# Patient Record
Sex: Male | Born: 1970 | Race: Black or African American | Hispanic: No | Marital: Married | Smoking: Never smoker
Health system: Southern US, Community
[De-identification: ages and names within clinical notes are randomized; demographics above are authoritative.]

## PROBLEM LIST (undated history)

## (undated) HISTORY — PX: ROTATOR CUFF REPAIR: SHX139

---

## 2005-10-24 ENCOUNTER — Emergency Department (HOSPITAL_COMMUNITY): Admission: EM | Admit: 2005-10-24 | Discharge: 2005-10-25 | Payer: Self-pay | Admitting: Emergency Medicine

## 2005-12-12 ENCOUNTER — Ambulatory Visit (HOSPITAL_COMMUNITY): Admission: RE | Admit: 2005-12-12 | Discharge: 2005-12-12 | Payer: Self-pay | Admitting: Orthopedic Surgery

## 2006-08-11 ENCOUNTER — Emergency Department (HOSPITAL_COMMUNITY): Admission: EM | Admit: 2006-08-11 | Discharge: 2006-08-11 | Payer: Self-pay | Admitting: Emergency Medicine

## 2013-11-04 ENCOUNTER — Ambulatory Visit: Payer: Managed Care, Other (non HMO)

## 2013-11-04 ENCOUNTER — Ambulatory Visit (INDEPENDENT_AMBULATORY_CARE_PROVIDER_SITE_OTHER): Payer: Managed Care, Other (non HMO) | Admitting: Internal Medicine

## 2013-11-04 VITALS — BP 124/72 | HR 65 | Temp 97.5°F | Resp 18 | Ht 66.0 in | Wt 171.0 lb

## 2013-11-04 DIAGNOSIS — R0989 Other specified symptoms and signs involving the circulatory and respiratory systems: Secondary | ICD-10-CM

## 2013-11-04 DIAGNOSIS — J301 Allergic rhinitis due to pollen: Secondary | ICD-10-CM

## 2013-11-04 DIAGNOSIS — Z23 Encounter for immunization: Secondary | ICD-10-CM

## 2013-11-04 DIAGNOSIS — Z0000 Encounter for general adult medical examination without abnormal findings: Secondary | ICD-10-CM

## 2013-11-04 DIAGNOSIS — R0683 Snoring: Secondary | ICD-10-CM

## 2013-11-04 DIAGNOSIS — J329 Chronic sinusitis, unspecified: Secondary | ICD-10-CM

## 2013-11-04 DIAGNOSIS — R0609 Other forms of dyspnea: Secondary | ICD-10-CM

## 2013-11-04 LAB — COMPLETE METABOLIC PANEL WITH GFR
ALBUMIN: 4.7 g/dL (ref 3.5–5.2)
ALK PHOS: 61 U/L (ref 39–117)
ALT: 19 U/L (ref 0–53)
AST: 27 U/L (ref 0–37)
BUN: 9 mg/dL (ref 6–23)
CO2: 29 meq/L (ref 19–32)
CREATININE: 1.07 mg/dL (ref 0.50–1.35)
Calcium: 9.6 mg/dL (ref 8.4–10.5)
Chloride: 103 mEq/L (ref 96–112)
GFR, EST NON AFRICAN AMERICAN: 85 mL/min
GFR, Est African American: >89 mL/min
GLUCOSE: 93 mg/dL (ref 70–99)
Potassium: 4.2 mEq/L (ref 3.5–5.3)
SODIUM: 139 meq/L (ref 135–145)
Total Bilirubin: 0.5 mg/dL (ref 0.2–1.2)
Total Protein: 7.3 g/dL (ref 6.0–8.3)

## 2013-11-04 LAB — LIPID PANEL
CHOL/HDL RATIO: 2.6 ratio
CHOLESTEROL: 203 mg/dL — AB (ref 0–200)
HDL: 77 mg/dL (ref >39)
LDL Cholesterol: 117 mg/dL — ABNORMAL HIGH (ref 0–99)
TRIGLYCERIDES: 43 mg/dL (ref <150)
VLDL: 9 mg/dL (ref 0–40)

## 2013-11-04 LAB — POCT CBC
Granulocyte percent: 69.5 %G (ref 37–80)
HEMATOCRIT: 43.1 % — AB (ref 43.5–53.7)
Hemoglobin: 13.7 g/dL — AB (ref 14.1–18.1)
Lymph, poc: 1.3 (ref 0.6–3.4)
MCH: 29.4 pg (ref 27–31.2)
MCHC: 31.8 g/dL (ref 31.8–35.4)
MCV: 92.5 fL (ref 80–97)
MID (CBC): 0.3 (ref 0–0.9)
MPV: 10 fL (ref 0–99.8)
POC GRANULOCYTE: 3.5 (ref 2–6.9)
POC LYMPH %: 25.1 % (ref 10–50)
POC MID %: 5.4 %M (ref 0–12)
Platelet Count, POC: 230 10*3/uL (ref 142–424)
RBC: 4.66 M/uL — AB (ref 4.69–6.13)
RDW, POC: 13.7 %
WBC: 5.1 10*3/uL (ref 4.6–10.2)

## 2013-11-04 MED ORDER — FLUTICASONE PROPIONATE 50 MCG/ACT NA SUSP: NASAL | Status: AC

## 2013-11-04 MED ORDER — AMOXICILLIN 500 MG PO CAPS: 1000.0000 mg | ORAL_CAPSULE | Freq: Two times a day (BID) | ORAL | Status: AC

## 2013-11-04 MED ORDER — MONTELUKAST SODIUM 10 MG PO TABS: 10.0000 mg | ORAL_TABLET | Freq: Every day | ORAL | Status: AC

## 2013-11-04 NOTE — Progress Notes (Signed)
Subjective:    Patient ID: Johnny Larsen, male    DOB: 01-23-1971, 43 y.o.   MRN: [id number] Here for his first CPE in many hours Basically healthy Works hard and enjoys it Married with children 21 and 60 Wonders if his testosterone is low Sinus symptoms are interfering with life  Sinusitis This is a chronic problem. The current episode started more than 1 year ago. The problem has been gradually worsening since onset. There has been no fever. His pain is at a severity of 2/10. The pain is mild. Associated symptoms include congestion, headaches, a hoarse voice, sinus pressure and sneezing. Pertinent negatives include no coughing, diaphoresis, ear pain, neck pain, shortness of breath, sore throat or swollen glands. Past treatments include saline sprays. The treatment provided no relief.   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  HENT: Positive for congestion, hoarse voice, sinus pressure and sneezing. Negative for ear pain and sore throat.   Respiratory: Negative for cough and shortness of breath.   Musculoskeletal: Negative for neck pain.  Neurological: Positive for headaches.  Review of systems negative except for sinus congestion symptoms, snoring Significantly there is no daytime hypersomnolence or nonrestorative sleep and no observed sleep apnea Snoring is significant for wife to need to sleep somewhere else There is very heavy breathing all the time History of nasal problems as a child    Objective:   Physical Exam  Constitutional: He is oriented to person, place, and time. He appears well-developed and well-nourished.  HENT:  Head: Normocephalic.  Right Ear: External ear normal.  Left Ear: External  ear normal.  Nose: Nose normal.  Mouth/Throat: Oropharynx is clear and moist.  Tms and canals clear Narrow posterior pharynx Boggy turbinates Nasal intonation significant  Eyes: Conjunctivae and EOM are normal. Pupils are equal, round, and reactive to light.  Neck: Normal range of motion. Neck supple. No thyromegaly present.  Cardiovascular: Normal rate, regular rhythm, normal heart sounds and intact distal pulses.   No murmur heard. Pulmonary/Chest: Effort normal and breath sounds normal. No respiratory distress. He has no wheezes. He has no rales.  Abdominal: Soft. Bowel sounds are normal. He exhibits no distension and no mass. There is no tenderness. There is no rebound and no guarding.  No hepatosplenomegaly  Musculoskeletal: Normal range of motion. He exhibits no edema and no tenderness.  Lymphadenopathy:    He has no cervical adenopathy.  Neurological: He is alert and oriented to person, place, and time. He has normal reflexes. No cranial nerve deficit. He exhibits normal muscle tone. Coordination normal.  Skin: Skin is warm and dry. No rash noted.  Psychiatric: He has a normal mood and affect. His behavior is normal. Judgment and thought content normal.    [facility] reading (PRIMARY) by  Dr. Merla Riches chest x-ray clear Results for orders placed in visit on 11/04/13  POCT CBC      Result Value Ref Range   WBC 5.1  4.6 - 10.2 K/uL   Lymph, poc 1.3  0.6 - 3.4   POC LYMPH PERCENT 25.1  10 - 50 %L   MID (cbc) 0.3  0 - 0.9   POC MID % 5.4  0 - 12 %M   POC Granulocyte 3.5  2 -  6.9   Granulocyte percent 69.5  37 - 80 %G   RBC 4.66 (*) 4.69 - 6.13 M/uL   Hemoglobin 13.7 (*) 14.1 - 18.1 g/dL   HCT, POC 40.943.1 (*) 81.143.5 - 53.7 %   MCV 92.5  80 - 97 fL   MCH, POC 29.4  27 - 31.2 pg   MCHC 31.8  31.8 - 35.4 g/dL   RDW, POC 91.413.7     Platelet Count, POC 230  142 - 424 K/uL   MPV 10  0 - 99.8 fL           Assessment & Plan:  Routine general medical examination at a health care  facility - Plan: POCT CBC, Lipid panel, COMPLETE METABOLIC PANEL WITH GFR, PSA, Testosterone, free, total  Snoring - Plan: DG Chest 2 View  Allergic rhinitis due to pollen  Sinusitis, chronic  Meds ordered this encounter  Medications  . fluticasone (FLONASE) 50 MCG/ACT nasal spray    Sig: 2 sprays each nostril twice a day for the next 2 months    Dispense:  16 g    Refill:  6  . montelukast (SINGULAIR) 10 MG tablet    Sig: Take 1 tablet (10 mg total) by mouth at bedtime.    Dispense:  30 tablet    Refill:  3  . amoxicillin (AMOXIL) 500 MG capsule    Sig: Take 2 capsules (1,000 mg total) by mouth 2 (two) times daily.    Dispense:  40 capsule    Refill:  0   if sinus congestion and snoring are not cleared in 4-6 weeks he will call for a referral to ENT for nasoscopy  Mail lab result

## 2013-11-05 LAB — TESTOSTERONE, FREE, TOTAL, SHBG
SEX HORMONE BINDING: 57 nmol/L (ref 13–71)
TESTOSTERONE FREE: 103.5 pg/mL (ref 47.0–244.0)
TESTOSTERONE-% FREE: 1.5 % — AB (ref 1.6–2.9)
TESTOSTERONE: 677 ng/dL (ref 300–890)

## 2013-11-05 LAB — PSA: PSA: 0.52 ng/mL (ref <=4.00)

## 2014-01-02 ENCOUNTER — Ambulatory Visit (INDEPENDENT_AMBULATORY_CARE_PROVIDER_SITE_OTHER): Payer: Managed Care, Other (non HMO) | Admitting: Physician Assistant

## 2014-01-02 VITALS — BP 118/72 | HR 76 | Temp 97.8°F | Resp 18 | Ht 66.0 in | Wt 170.8 lb

## 2014-01-02 DIAGNOSIS — H0014 Chalazion left upper eyelid: Secondary | ICD-10-CM

## 2014-01-02 DIAGNOSIS — H0019 Chalazion unspecified eye, unspecified eyelid: Secondary | ICD-10-CM

## 2014-01-02 NOTE — Progress Notes (Signed)
   Subjective:    Patient ID: Johnny Larsen, male    DOB: 1971-07-29, 43 y.o.   MRN: [id number]  HPI Pt presents to clinic with lateral upper left eyelid swelling.  Started with edge of eyelid irritated last week with slight erythema and then the entire upper eyelid got slightly swollen.  Then he noticed that he has a local area of swelling, "like a ball" on his upper left eyelid.  The area does not hurt that much but it is irritating.  He has had no problems with the eyeball.  No vision changes.  He Has slight drainage in the lateral corner of his eye in the am that is crusting. He has been using warm compresses and thinks it might be a little smaller today. He has not ever seen an eye dr. Review of Systems  HENT: Negative.   Eyes: Positive for discharge. Negative for photophobia, pain, redness, itching and visual disturbance.       Objective:   Physical Exam  Vitals reviewed. Constitutional: He is oriented to person, place, and time. He appears well-developed and well-nourished.  HENT:  Head: Normocephalic and atraumatic.  Right Ear: External ear normal.  Left Ear: External ear normal.  Eyes: Conjunctivae, EOM and lids are normal. Pupils are equal, round, and reactive to light. Right eye exhibits no hordeolum. Left eye exhibits no hordeolum.    Pulmonary/Chest: Effort normal.  Abdominal: Soft.  Neurological: He is alert and oriented to person, place, and time.  Skin: Skin is warm and dry.  Psychiatric: He has a normal mood and affect. His behavior is normal. Judgment and thought content normal.      Assessment & Plan:  Chalazion of left upper eyelid - Plan: Ambulatory referral to Ophthalmology pt will continue warm compresses and gentle washing of the eyelid.  We will go ahead with the referral due to the fact that he has had the area for 8 days.  Benny Lennert PA-C  [facility] 01/02/2014 5:00 PM

## 2014-01-02 NOTE — Patient Instructions (Signed)
[employer] no tears shampoo to the eye, gentle warm compresses  Chalazion A chalazion is a swelling or hard lump on the eyelid caused by a blocked oil gland. Chalazions may occur on the upper or the lower eyelid.  CAUSES  Oil gland in the eyelid becomes blocked. SYMPTOMS   Swelling or hard lump on the eyelid. This lump may make it hard to see out of the eye.  The swelling may spread to areas around the eye. TREATMENT   Although some chalazions disappear by themselves in 1 or 2 months, some chalazions may need to be removed.  Medicines to treat an infection may be required. HOME CARE INSTRUCTIONS   Wash your hands often and dry them with a clean towel. Do not touch the chalazion.  Apply heat to the eyelid several times a day for 10 minutes to help ease discomfort and bring any yellowish white fluid (pus) to the surface. One way to apply heat to a chalazion is to use the handle of a metal spoon.  Hold the handle under hot water until it is hot, and then wrap the handle in paper towels so that the heat can come through without burning your skin.  Hold the wrapped handle against the chalazion and reheat the spoon handle as needed.  Apply heat in this fashion for 10 minutes, 4 times per day.  Return to your caregiver to have the pus removed if it does not break (rupture) on its own.  Do not try to remove the pus yourself by squeezing the chalazion or sticking it with a pin or needle.  Only take over-the-counter or prescription medicines for pain, discomfort, or fever as directed by your caregiver. SEEK IMMEDIATE MEDICAL CARE IF:   You have pain in your eye.  Your vision changes.  The chalazion does not go away.  The chalazion becomes painful, red, or swollen, grows larger, or does not start to disappear after 2 weeks. MAKE SURE YOU:   Understand these instructions.  Will watch your condition.  Will get help right away if you are not doing well or get worse. Document  Released: 07/28/2000 Document Revised: 10/23/2011 Document Reviewed: 11/15/2009 [facility] Patient Information 2014 [place], [place].

## 2016-07-23 ENCOUNTER — Emergency Department (HOSPITAL_COMMUNITY): Payer: 59

## 2016-07-23 ENCOUNTER — Emergency Department (HOSPITAL_COMMUNITY)
Admission: EM | Admit: 2016-07-23 | Discharge: 2016-07-23 | Disposition: A | Discharge status: Home / Self Care | Payer: 59 | Attending: Emergency Medicine | Admitting: Emergency Medicine

## 2016-07-23 ENCOUNTER — Encounter (HOSPITAL_COMMUNITY): Payer: Self-pay | Admitting: Oncology

## 2016-07-23 DIAGNOSIS — R509 Fever, unspecified: Secondary | ICD-10-CM | POA: Diagnosis present

## 2016-07-23 DIAGNOSIS — J181 Lobar pneumonia, unspecified organism: Secondary | ICD-10-CM | POA: Diagnosis not present

## 2016-07-23 DIAGNOSIS — J189 Pneumonia, unspecified organism: Secondary | ICD-10-CM

## 2016-07-23 LAB — BASIC METABOLIC PANEL
ANION GAP: 7 (ref 5–15)
BUN: 15 mg/dL (ref 6–20)
CALCIUM: 9.1 mg/dL (ref 8.9–10.3)
CHLORIDE: 105 mmol/L (ref 101–111)
CO2: 25 mmol/L (ref 22–32)
Creatinine, Ser: 1.32 mg/dL — ABNORMAL HIGH (ref 0.61–1.24)
GFR calc non Af Amer: >60 mL/min (ref >60)
GLUCOSE: 162 mg/dL — AB (ref 65–99)
POTASSIUM: 3.5 mmol/L (ref 3.5–5.1)
Sodium: 137 mmol/L (ref 135–145)

## 2016-07-23 LAB — CBC WITH DIFFERENTIAL/PLATELET
BASOS PCT: 0 %
Basophils Absolute: 0 10*3/uL (ref 0.0–0.1)
Eosinophils Absolute: 0 10*3/uL (ref 0.0–0.7)
Eosinophils Relative: 0 %
HEMATOCRIT: 37.7 % — AB (ref 39.0–52.0)
HEMOGLOBIN: 13.5 g/dL (ref 13.0–17.0)
LYMPHS PCT: 3 %
Lymphs Abs: 0.7 10*3/uL (ref 0.7–4.0)
MCH: 30.1 pg (ref 26.0–34.0)
MCHC: 35.8 g/dL (ref 30.0–36.0)
MCV: 84.2 fL (ref 78.0–100.0)
MONO ABS: 1.3 10*3/uL — AB (ref 0.1–1.0)
MONOS PCT: 6 %
NEUTROS ABS: 18.2 10*3/uL — AB (ref 1.7–7.7)
NEUTROS PCT: 91 %
Platelets: 179 10*3/uL (ref 150–400)
RBC: 4.48 MIL/uL (ref 4.22–5.81)
RDW: 12.4 % (ref 11.5–15.5)
WBC: 20.1 10*3/uL — ABNORMAL HIGH (ref 4.0–10.5)

## 2016-07-23 MED ORDER — SODIUM CHLORIDE 0.9 % IV BOLUS (SEPSIS)
1000.0000 mL | Freq: Once | INTRAVENOUS | Status: AC
  Administered 2016-07-23: 1000 mL via INTRAVENOUS

## 2016-07-23 MED ORDER — ACETAMINOPHEN 325 MG PO TABS
650.0000 mg | ORAL_TABLET | Freq: Once | ORAL | Status: AC
  Administered 2016-07-23: 650 mg via ORAL
  Filled 2016-07-23: qty 2

## 2016-07-23 MED ORDER — AZITHROMYCIN 250 MG PO TABS: ORAL_TABLET | ORAL | 0 refills | Status: AC

## 2016-07-23 MED ORDER — DEXTROSE 5 % IV SOLN
1.0000 g | Freq: Once | INTRAVENOUS | Status: AC
  Administered 2016-07-23: 1 g via INTRAVENOUS
  Filled 2016-07-23: qty 10

## 2016-07-23 MED ORDER — AZITHROMYCIN 250 MG PO TABS
500.0000 mg | ORAL_TABLET | Freq: Once | ORAL | Status: AC
  Administered 2016-07-23: 500 mg via ORAL
  Filled 2016-07-23: qty 2

## 2016-07-23 MED ORDER — IBUPROFEN 800 MG PO TABS
800.0000 mg | ORAL_TABLET | Freq: Once | ORAL | Status: AC
  Administered 2016-07-23: 800 mg via ORAL
  Filled 2016-07-23: qty 1

## 2016-07-23 NOTE — ED Provider Notes (Signed)
[facility]-EMERGENCY DEPT Provider Note   CSN: [id number] Arrival date & time: 07/23/16  0108  By signing my name below, I, Modena JanskyAlbert Thayil, attest that this documentation has been prepared under the direction and in the presence of Zadie Rhineonald Kalyani Maeda, MD . Electronically Signed: Modena JanskyAlbert Thayil, Scribe. 07/23/2016. 1:24 AM.  History   Chief Complaint Chief Complaint  Patient presents with  . Fever    The history is provided by the patient. No language interpreter was used.  Fever   This is a new problem. The current episode started 3 to 5 hours ago. The problem occurs constantly. The problem has not changed since onset.His temperature was unmeasured prior to arrival. Associated symptoms include headaches. Pertinent negatives include no diarrhea, no sore throat and no cough. Treatments tried: tylenol. The treatment provided no relief.   HPI Comments: Johnny Larsen is a 45 y.o. male who presents to the Emergency Department complaining of a constant moderate subjective fever that started today. He states he had a sudden onset of associated tachycardia, SOB, headache, lightheadedness, and dizziness. Pt's temperature in the ED today was 103.1. He states he took tylenol for the fever 2.5 hours ago with no relief. He denies any recent travel, cough, diarrhea, rash, sore throat, or other complaints.    PMH - none Soc hx - nonsmoker Past Surgical History:  Procedure Laterality Date  . ROTATOR CUFF REPAIR         Home Medications    Prior to Admission medications   Medication Sig Start Date End Date Taking? Authorizing Provider  fluticasone (FLONASE) 50 MCG/ACT nasal spray 2 sprays each nostril twice a day for the next 2 months 11/04/13   Tonye Pearsonobert P Doolittle, MD  montelukast (SINGULAIR) 10 MG tablet Take 1 tablet (10 mg total) by mouth at bedtime. 11/04/13   Tonye Pearsonobert P Doolittle, MD    Family History No family history on file.  Social History Social History  Substance Use Topics  .  Smoking status: Never Smoker  . Smokeless tobacco: Never Used  . Alcohol use 1.5 oz/week    3 Standard drinks or equivalent per week     Allergies   Patient has no known allergies.   Review of Systems Review of Systems  Constitutional: Positive for fever (Tmax: 103.1).  HENT: Negative for sore throat.   Respiratory: Positive for shortness of breath. Negative for cough.   Cardiovascular: Positive for palpitations.  Gastrointestinal: Negative for diarrhea.  Skin: Negative for rash.  Neurological: Positive for dizziness, light-headedness and headaches.  All other systems reviewed and are negative.    Physical Exam Updated Vital Signs BP 113/72 (BP Location: Right Arm)   Pulse 112   Temp (!) 103.1 F (39.5 C) (Oral)   Resp 20   Ht 5\' 7"  (1.702 m)   Wt 165 lb (74.8 kg)   SpO2 95%   BMI 25.84 kg/m   Physical Exam  Nursing note and vitals reviewed. CONSTITUTIONAL: Well developed/well nourished HEAD: Normocephalic/atraumatic EYES: EOMI/PERRL ENMT: Mucous membranes moist, uvula midline, no exudates,  NECK: supple no meningeal signs SPINE/BACK:entire spine nontender CV: S1/S2 noted, no murmurs/rubs/gallops noted LUNGS: Mild tacynpea noted, decreased breath sounds bilaterally  ABDOMEN: soft, nontender, no rebound or guarding, bowel sounds noted throughout abdomen GU:no cva tenderness NEURO: Pt is awake/alert/appropriate, moves all extremitiesx4.  No facial droop.   EXTREMITIES: pulses normal/equal, full ROM SKIN: warm, color normal PSYCH: no abnormalities of mood noted, alert and oriented to situation  ED  Treatments / Results  DIAGNOSTIC STUDIES: Oxygen Saturation is 95% on RA, normal by my interpretation.    COORDINATION OF CARE: 1:33 AM- Pt advised of plan for treatment and pt agrees.  Labs (all labs ordered are listed, but only abnormal results are displayed) Labs Reviewed  BASIC METABOLIC PANEL - Abnormal; Notable for the following:       Result Value    Glucose, Bld 162 (*)    Creatinine, Ser 1.32 (*)    All other components within normal limits  CBC WITH DIFFERENTIAL/PLATELET - Abnormal; Notable for the following:    WBC 20.1 (*)    HCT 37.7 (*)    Neutro Abs 18.2 (*)    Monocytes Absolute 1.3 (*)    All other components within normal limits    EKG  EKG Interpretation None       Radiology Dg Chest 2 View  Result Date: 07/23/2016 CLINICAL DATA:  Fever chills dyspnea and body aches today. EXAM: CHEST  2 VIEW COMPARISON:  11/04/2013 FINDINGS: Rounded opacity in the right midlung, probably located in the right middle lobe. Given the clinical history this likely represents pneumonia, but neoplasm is not entirely excluded. Mild atelectatic appearing opacities are present in the left base. Pulmonary vasculature is normal. No pleural effusions. IMPRESSION: Rounded right middle lobe opacity, probably pneumonia. Followup PA and lateral chest X-ray is recommended in 3-4 weeks following trial of antibiotic therapy to ensure resolution and exclude underlying malignancy. Electronically Signed   By: Ellery Plunkaniel R Mitchell M.D.   On: 07/23/2016 02:07    Procedures Procedures (including critical care time)  Medications Ordered in ED Medications  cefTRIAXone (ROCEPHIN) 1 g in dextrose 5 % 50 mL IVPB (1 g Intravenous Not Given 07/23/16 0227)  ibuprofen (ADVIL,MOTRIN) tablet 800 mg (800 mg Oral Given 07/23/16 0134)  sodium chloride 0.9 % bolus 1,000 mL (0 mLs Intravenous Stopped 07/23/16 0228)  azithromycin (ZITHROMAX) tablet 500 mg (500 mg Oral Given 07/23/16 0226)     Initial Impression / Assessment and Plan / ED Course  I have reviewed the triage vital signs and the nursing notes.  Pertinent labs & imaging results that were available during my care of the patient were reviewed by me and considered in my medical decision making (see chart for details).  Clinical Course     2:37 AM Pt found to have pneumonia by CXR This would explain  fever/dizziness/sob Pt stable, not septic appearing and no other significant health problems I anticipate discharge home 3:29 AM Pt continues to improve No hypotension Tachycardia improved No respiratory distress noted Pt resting comfortably 5:04 AM Pt improved Vitals improved He is ambulatory without difficulty No hypoxia He is taking PO fluid HA is resolved He would like to go home We discussed strict ER return precautions Also advised need for repeat CXR in one month to ensure infiltrate resolved  Final Clinical Impressions(s) / ED Diagnoses   Final diagnoses:  Community acquired pneumonia of right middle lobe of lung (HCC)    New Prescriptions New Prescriptions   AZITHROMYCIN (ZITHROMAX) 250 MG TABLET    One tablet PO daily for 4 days - start on 07/24/16   I personally performed the services described in this documentation, which was scribed in my presence. The recorded information has been reviewed and is accurate.        Zadie Rhineonald Shelia [place], MD 07/23/16 [phone]

## 2016-07-23 NOTE — ED Notes (Signed)
Patient transported to X-ray 

## 2016-07-23 NOTE — ED Triage Notes (Signed)
Pt c/o fever, body aches, chills, shob and HA.  Per pt's s/o pt's gait is abnormal.  Pt rates pain 9/10.  Pt is also endorsing weakness, light headed.

## 2016-07-23 NOTE — ED Notes (Signed)
No reaction to medication noted resting in bed with eyes closed no respiratory or acute distress noted visitor at bedside.

## 2016-07-23 NOTE — ED Notes (Signed)
Drinking water tolerating fluids well.

## 2016-07-23 NOTE — Discharge Instructions (Addendum)
PLEASE HAVE FOLLOWUP XRAY OF YOUR CHEST IN ONE MONTH TO MAKE SURE THE PNEUMONIA HAS RESOLVED

## 2016-08-25 ENCOUNTER — Ambulatory Visit
Admission: RE | Admit: 2016-08-25 | Discharge: 2016-08-25 | Disposition: A | Discharge status: Home / Self Care | Payer: 59 | Source: Ambulatory Visit | Attending: Family Medicine | Admitting: Family Medicine

## 2016-08-25 ENCOUNTER — Other Ambulatory Visit: Payer: Self-pay | Admitting: Family Medicine

## 2016-08-25 DIAGNOSIS — J189 Pneumonia, unspecified organism: Secondary | ICD-10-CM

## 2016-08-25 DIAGNOSIS — J181 Lobar pneumonia, unspecified organism: Principal | ICD-10-CM

## 2019-01-26 ENCOUNTER — Emergency Department (HOSPITAL_COMMUNITY)
Admission: EM | Admit: 2019-01-26 | Discharge: 2019-01-26 | Disposition: A | Discharge status: Home / Self Care | Payer: Managed Care, Other (non HMO) | Attending: Emergency Medicine | Admitting: Emergency Medicine

## 2019-01-26 ENCOUNTER — Encounter (HOSPITAL_COMMUNITY): Payer: Self-pay

## 2019-01-26 ENCOUNTER — Other Ambulatory Visit: Payer: Self-pay

## 2019-01-26 DIAGNOSIS — S46212A Strain of muscle, fascia and tendon of other parts of biceps, left arm, initial encounter: Secondary | ICD-10-CM | POA: Diagnosis not present

## 2019-01-26 DIAGNOSIS — Y999 Unspecified external cause status: Secondary | ICD-10-CM | POA: Diagnosis not present

## 2019-01-26 DIAGNOSIS — S4992XA Unspecified injury of left shoulder and upper arm, initial encounter: Secondary | ICD-10-CM | POA: Diagnosis present

## 2019-01-26 DIAGNOSIS — Y9389 Activity, other specified: Secondary | ICD-10-CM | POA: Diagnosis not present

## 2019-01-26 DIAGNOSIS — Y929 Unspecified place or not applicable: Secondary | ICD-10-CM | POA: Diagnosis not present

## 2019-01-26 DIAGNOSIS — X500XXA Overexertion from strenuous movement or load, initial encounter: Secondary | ICD-10-CM | POA: Diagnosis not present

## 2019-01-26 MED ORDER — NAPROXEN 500 MG PO TABS: 500.0000 mg | ORAL_TABLET | Freq: Two times a day (BID) | ORAL | 0 refills | Status: AC | PRN
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
## 2019-01-26 NOTE — Discharge Instructions (Signed)
It was my pleasure taking care of you today!   Ice affected area (instructions below).   Take naproxen twice daily as needed for pain/swelling.  If you are not feeling improved by Friday, please call the orthopedic doctor listed to schedule a follow-up appointment.  Return to the emergency department for new numbness, new or worsening symptoms, any additional concerns.   COLD THERAPY DIRECTIONS:  Ice or gel packs can be used to reduce both pain and swelling. Ice is the most helpful within the first 24 to 48 hours after an injury or flareup from overusing a muscle or joint.  Ice is effective, has very few side effects, and is safe for most people to use.   If you expose your skin to cold temperatures for too long or without the proper protection, you can damage your skin or nerves. Watch for signs of skin damage due to cold.   HOME CARE INSTRUCTIONS  Follow these tips to use ice and cold packs safely.  Place a dry or damp towel between the ice and skin. A damp towel will cool the skin more quickly, so you may need to shorten the time that the ice is used.  For a more rapid response, add gentle compression to the ice.  Ice for no more than 10 to 20 minutes at a time. The bonier the area you are icing, the less time it will take to get the benefits of ice.  Check your skin after 5 minutes to make sure there are no signs of a poor response to cold or skin damage.  Rest 20 minutes or more in between uses.  Once your skin is numb, you can end your treatment. You can test numbness by very lightly touching your skin. The touch should be so light that you do not see the skin dimple from the pressure of your fingertip. When using ice, most people will feel these normal sensations in this order: cold, burning, aching, and numbness.

## 2020-08-11 DIAGNOSIS — N451 Epididymitis: Secondary | ICD-10-CM | POA: Diagnosis not present

## 2020-11-28 ENCOUNTER — Emergency Department (HOSPITAL_BASED_OUTPATIENT_CLINIC_OR_DEPARTMENT_OTHER)
Admission: EM | Admit: 2020-11-28 | Discharge: 2020-11-28 | Disposition: A | Discharge status: Home / Self Care | Payer: Managed Care, Other (non HMO) | Attending: Emergency Medicine | Admitting: Emergency Medicine

## 2020-11-28 ENCOUNTER — Other Ambulatory Visit: Payer: Self-pay

## 2020-11-28 ENCOUNTER — Encounter (HOSPITAL_BASED_OUTPATIENT_CLINIC_OR_DEPARTMENT_OTHER): Payer: Self-pay | Admitting: *Deleted

## 2020-11-28 ENCOUNTER — Emergency Department (HOSPITAL_BASED_OUTPATIENT_CLINIC_OR_DEPARTMENT_OTHER): Payer: Managed Care, Other (non HMO)

## 2020-11-28 DIAGNOSIS — S20419A Abrasion of unspecified back wall of thorax, initial encounter: Secondary | ICD-10-CM | POA: Insufficient documentation

## 2020-11-28 DIAGNOSIS — M25511 Pain in right shoulder: Secondary | ICD-10-CM | POA: Diagnosis not present

## 2020-11-28 DIAGNOSIS — S0990XA Unspecified injury of head, initial encounter: Secondary | ICD-10-CM | POA: Insufficient documentation

## 2020-11-28 DIAGNOSIS — S40811A Abrasion of right upper arm, initial encounter: Secondary | ICD-10-CM | POA: Diagnosis not present

## 2020-11-28 DIAGNOSIS — S40212A Abrasion of left shoulder, initial encounter: Secondary | ICD-10-CM | POA: Diagnosis not present

## 2020-11-28 DIAGNOSIS — T148XXA Other injury of unspecified body region, initial encounter: Secondary | ICD-10-CM

## 2020-11-28 DIAGNOSIS — S20411A Abrasion of right back wall of thorax, initial encounter: Secondary | ICD-10-CM | POA: Diagnosis not present

## 2020-11-28 DIAGNOSIS — S40812A Abrasion of left upper arm, initial encounter: Secondary | ICD-10-CM | POA: Diagnosis not present

## 2020-11-28 DIAGNOSIS — Y92410 Unspecified street and highway as the place of occurrence of the external cause: Secondary | ICD-10-CM | POA: Insufficient documentation

## 2020-11-28 DIAGNOSIS — S40211A Abrasion of right shoulder, initial encounter: Secondary | ICD-10-CM | POA: Diagnosis not present

## 2020-11-28 DIAGNOSIS — S2191XA Laceration without foreign body of unspecified part of thorax, initial encounter: Secondary | ICD-10-CM | POA: Insufficient documentation

## 2020-11-28 DIAGNOSIS — R21 Rash and other nonspecific skin eruption: Secondary | ICD-10-CM | POA: Diagnosis not present

## 2020-11-28 DIAGNOSIS — S2091XA Abrasion of unspecified parts of thorax, initial encounter: Secondary | ICD-10-CM | POA: Diagnosis not present

## 2020-11-28 DIAGNOSIS — S80812A Abrasion, left lower leg, initial encounter: Secondary | ICD-10-CM | POA: Insufficient documentation

## 2020-11-28 DIAGNOSIS — S80811A Abrasion, right lower leg, initial encounter: Secondary | ICD-10-CM | POA: Diagnosis not present

## 2020-11-28 DIAGNOSIS — R519 Headache, unspecified: Secondary | ICD-10-CM | POA: Diagnosis not present

## 2020-11-28 DIAGNOSIS — R079 Chest pain, unspecified: Secondary | ICD-10-CM | POA: Diagnosis not present

## 2020-11-28 DIAGNOSIS — S299XXA Unspecified injury of thorax, initial encounter: Secondary | ICD-10-CM | POA: Diagnosis present

## 2020-11-28 MED ORDER — MORPHINE SULFATE (PF) 4 MG/ML IV SOLN
4.0000 mg | Freq: Once | INTRAVENOUS | Status: AC
  Administered 2020-11-28: 4 mg via INTRAVENOUS
  Filled 2020-11-28: qty 1

## 2020-11-28 MED ORDER — SILVER SULFADIAZINE 1 % EX CREA: 1.0000 "application " | TOPICAL_CREAM | Freq: Two times a day (BID) | CUTANEOUS | 0 refills | Status: AC

## 2020-11-28 MED ORDER — ONDANSETRON HCL 4 MG/2ML IJ SOLN
4.0000 mg | Freq: Once | INTRAMUSCULAR | Status: AC
  Administered 2020-11-28: 4 mg via INTRAVENOUS
  Filled 2020-11-28: qty 2

## 2020-11-28 MED ORDER — OXYCODONE-ACETAMINOPHEN 5-325 MG PO TABS
1.0000 | ORAL_TABLET | Freq: Once | ORAL | Status: AC
  Administered 2020-11-28: 1 via ORAL
  Filled 2020-11-28: qty 1

## 2020-11-28 MED ORDER — SILVER SULFADIAZINE 1 % EX CREA
TOPICAL_CREAM | Freq: Once | CUTANEOUS | Status: AC
  Filled 2020-11-28: qty 85

## 2020-11-28 MED ORDER — OXYCODONE-ACETAMINOPHEN 5-325 MG PO TABS: 1.0000 | ORAL_TABLET | Freq: Four times a day (QID) | ORAL | 0 refills | Status: AC | PRN
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
## 2020-11-28 NOTE — ED Notes (Signed)
Pt in CT.

## 2020-12-06 DIAGNOSIS — T07XXXA Unspecified multiple injuries, initial encounter: Secondary | ICD-10-CM | POA: Diagnosis not present

## 2021-01-31 DIAGNOSIS — E7800 Pure hypercholesterolemia, unspecified: Secondary | ICD-10-CM | POA: Diagnosis not present

## 2021-01-31 DIAGNOSIS — R202 Paresthesia of skin: Secondary | ICD-10-CM | POA: Diagnosis not present

## 2021-01-31 DIAGNOSIS — L818 Other specified disorders of pigmentation: Secondary | ICD-10-CM | POA: Diagnosis not present
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
